# Patient Record
Sex: Male | Born: 1955 | Race: White | Hispanic: No | Marital: Married
Health system: Southern US, Community
[De-identification: ages and names within clinical notes are randomized; demographics above are authoritative.]

---

## 2007-03-04 ENCOUNTER — Emergency Department (HOSPITAL_COMMUNITY): Admission: EM | Admit: 2007-03-04 | Discharge: 2007-03-04 | Payer: Self-pay | Admitting: Emergency Medicine

## 2009-09-20 IMAGING — CR DG HAND COMPLETE 3+V*R*
3 series · 3 of 3 positions shown · non-contrast
Comparison: None

CLINICAL DATA: Trauma

RIGHT HAND - 3 VIEW

[x hand pa right]
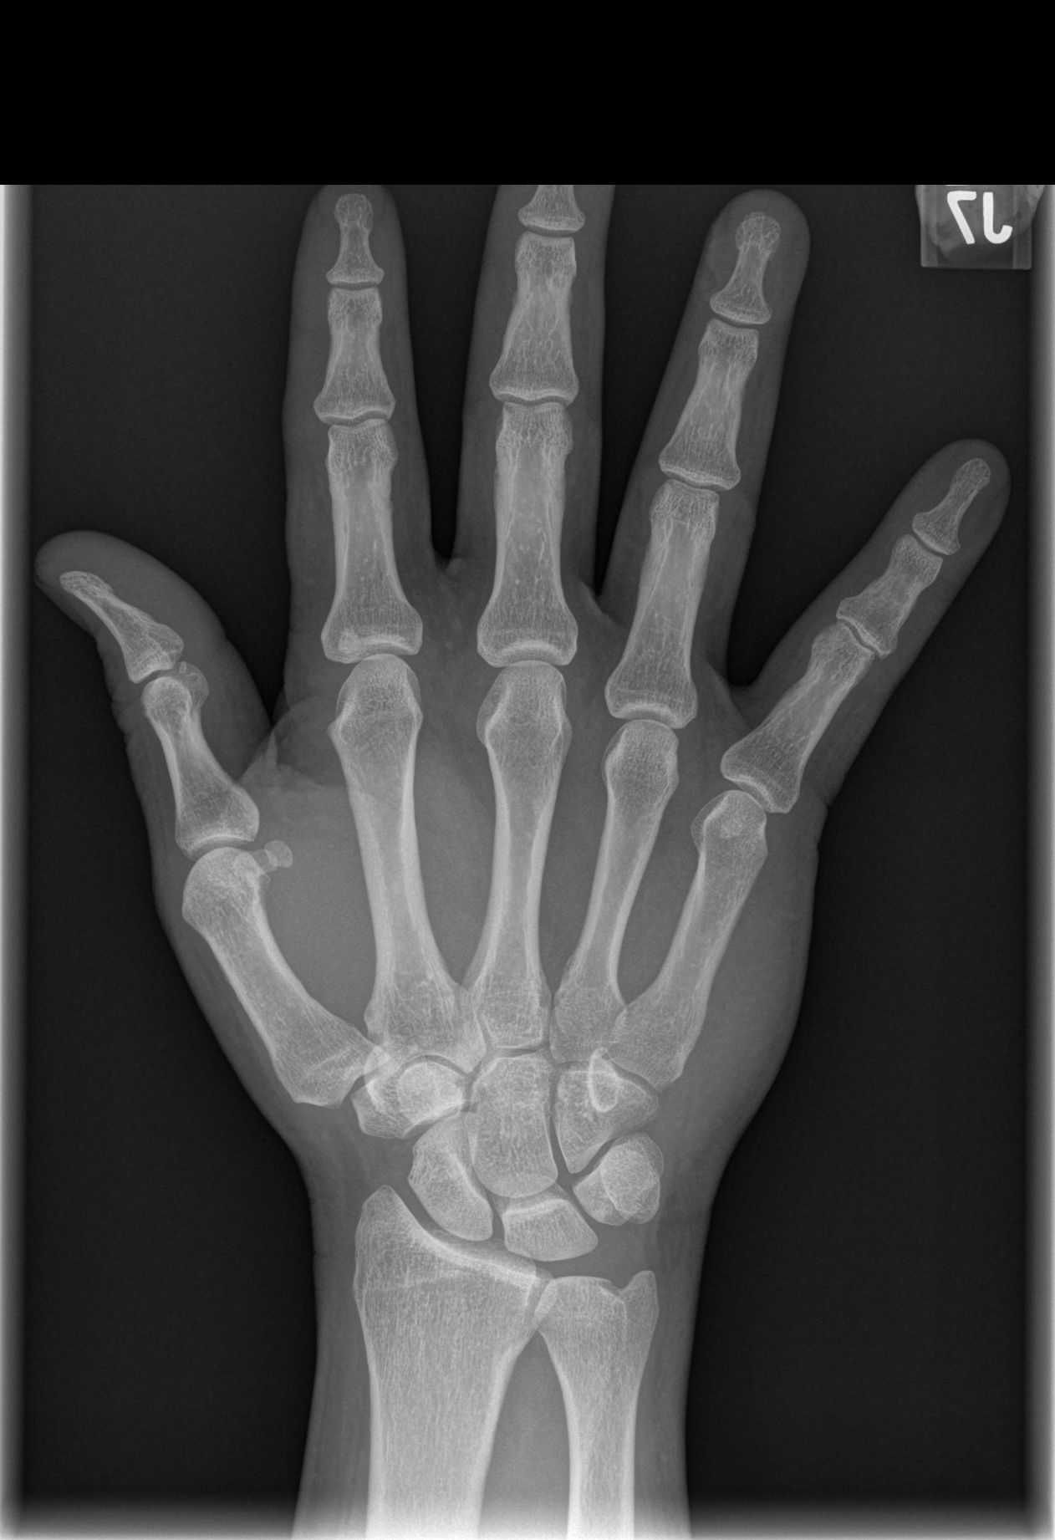

[x hand oblique right]
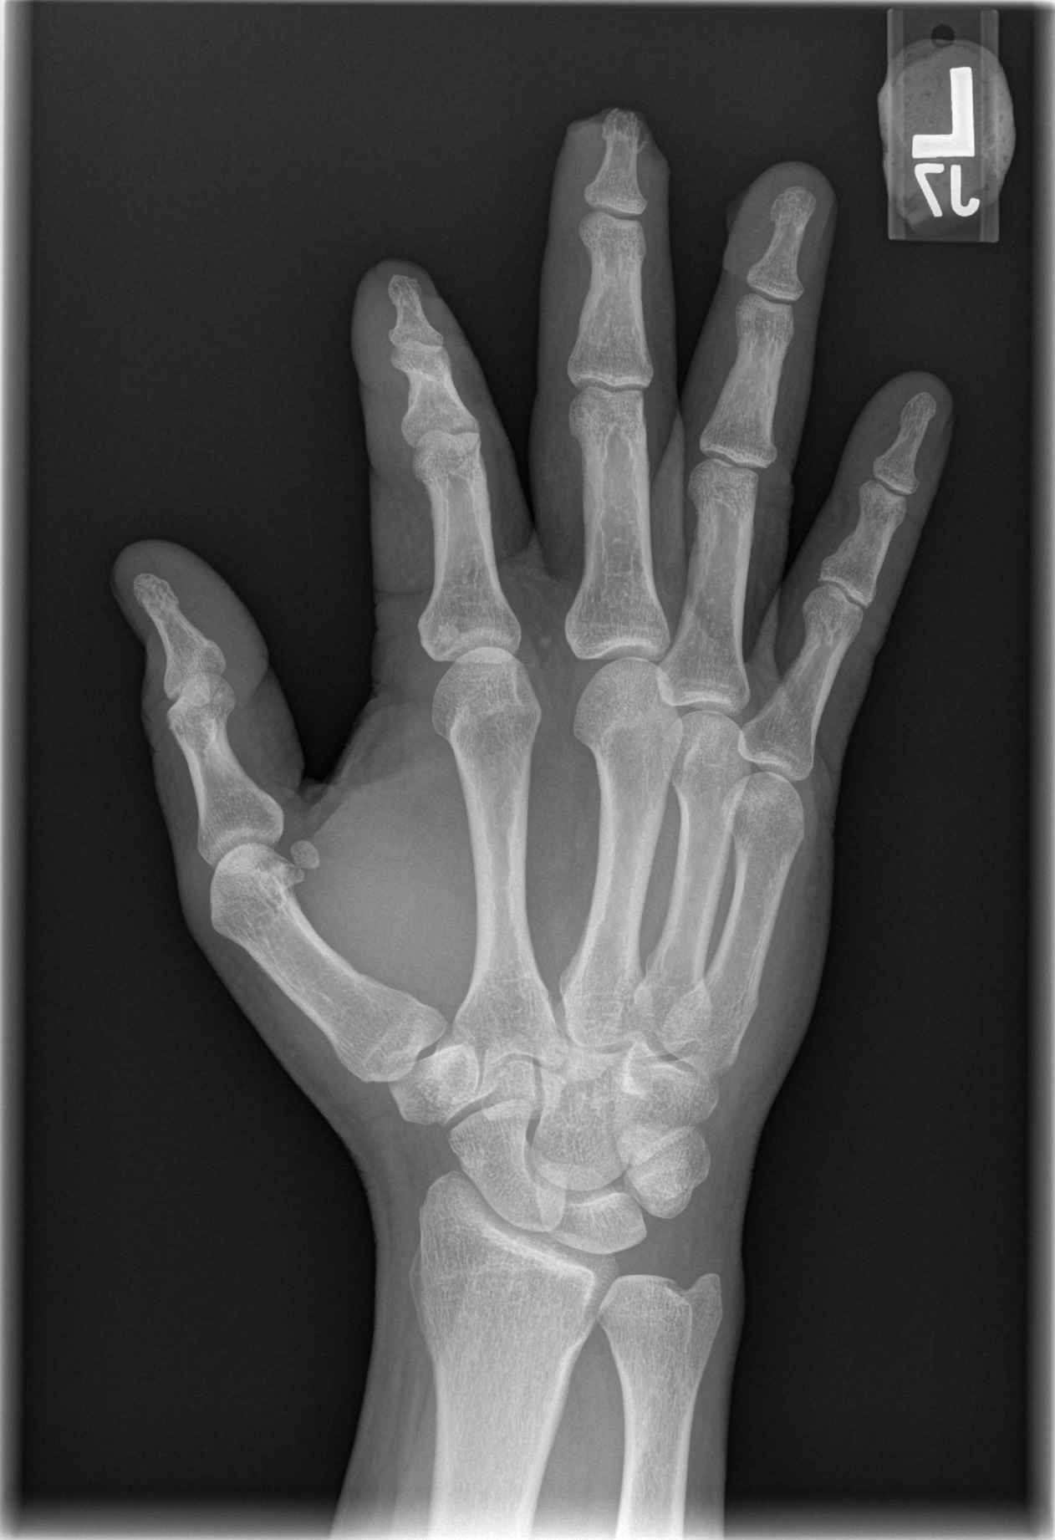

[x hand lat right]
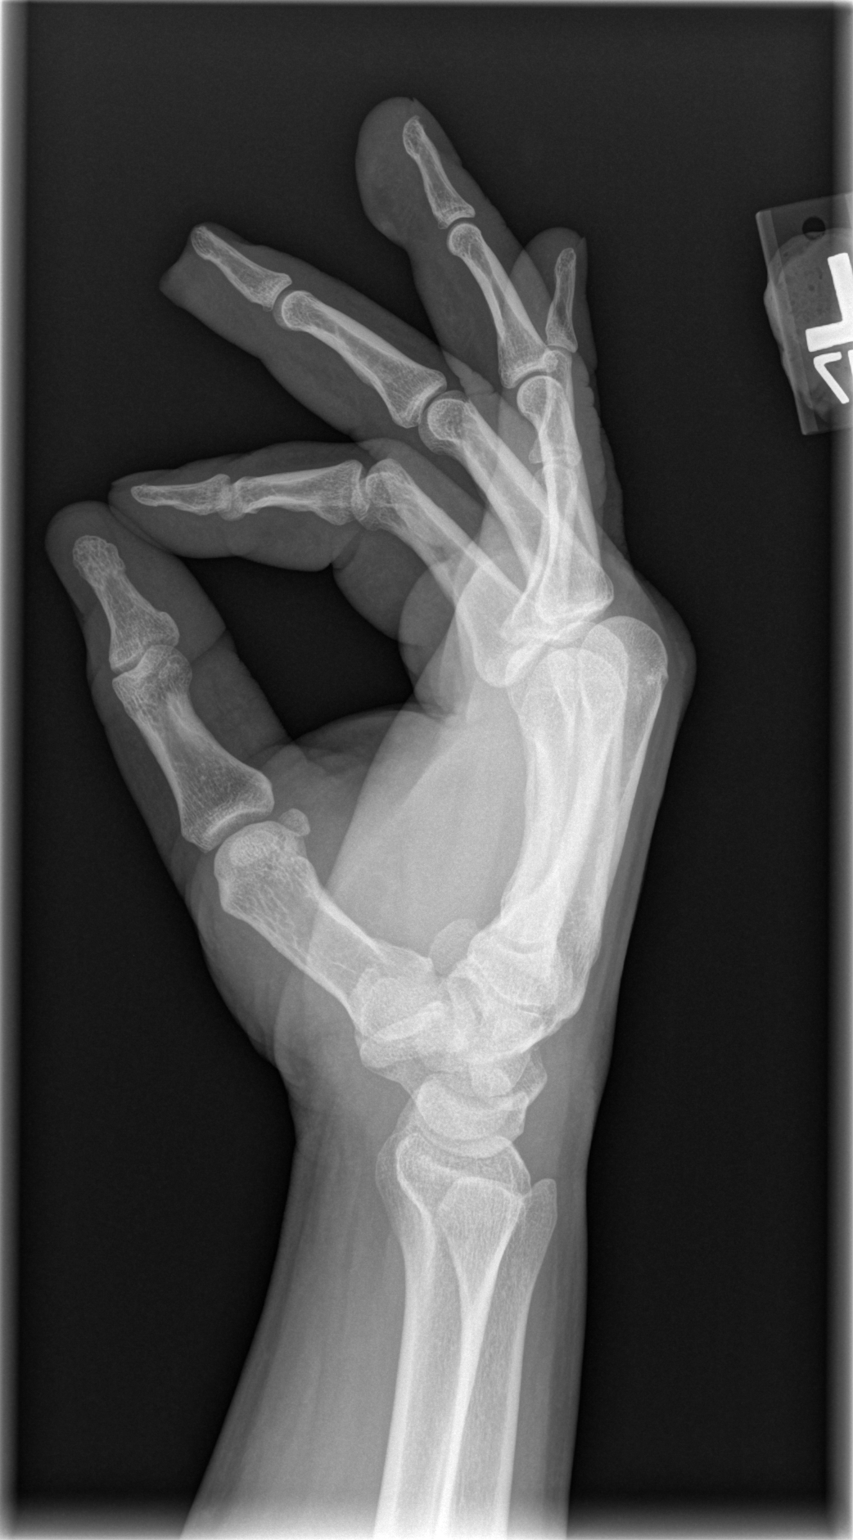

[3 of 3 positions shown; findings below may reference images not displayed]

FINDINGS: Soft tissue injury to the tuft of the third ray. Radiopaque objects
about the ulnar side of the distal phalanx on the AP and oblique views are felt
most likely represent bone fragments. Less likely, these could represent
radiopaque foreign bodies. Not localized on lateral view.

IMPRESSION

Soft tissue injury about the tuft of the third ray with probable small bone
fragments. Less likely radiopaque foreign bodies.

## 2010-08-30 NOTE — Op Note (Signed)
NAMEJACQUES, FIFE               ACCOUNT NO.:  [id number]   MEDICAL RECORD NO.:  [id number]          PATIENT TYPE:  INP   LOCATION:  1827                         FACILITY:  [facility]   PHYSICIAN:  Madelynn Done, MD  DATE OF BIRTH:  [birth year]   DATE OF PROCEDURE:  03/04/2007  DATE OF DISCHARGE:                               OPERATIVE REPORT   PREOPERATIVE DIAGNOSES:  1. Right long right long finger distal tip amputation with open distal      phalanx fracture and exposed bone.  2. Right long finger crush injury with laceration to the volar aspect      of the finger measuring 2.5 cm.   ATTENDING SURGEON:  Dr. Bradly Bienenstock who was scrubbed and present the  entire procedure.   ASSISTANT SURGEON:  None.   PROCEDURE:  1. Right long finger revision amputation with local advancement flaps      and closure.  2. Right long finger debridement of the skin, subcutaneous tissue and      bone associated with open fracture.  3. Right ring finger debridement of skin, subcutaneous tissue and      wound closure, laceration.   ANESTHESIA:  MAC with local anesthesia 10 mL of 0.5% Marcaine, 5 mL of  1% lidocaine mixture digital block of each digit.   TOURNIQUET TIME:  39 minutes at 250 mmHg.   SURGICAL INDICATIONS:  Mr. Carns is a 55 year old right-hand-dominant  gentleman who sustained a crush injury to his the right long and ring  fingers.  He presented to the emergency department with amputations of  the distal portion of the long finger and soft tissue injury to the ring  finger.  The patient seen and evaluated in the emergency department and  signed informed consent was obtained to proceed with the above  procedure.  Risks include but not limited to bleeding, infection and  need for further surgical intervention, loss of motion of the digits,  sensitivity at the tip of the finger and damage to nearby nerves,  arteries or tendons.   DESCRIPTION OF PROCEDURE:  The patient properly  identified in  preoperative holding area and a mark with a permanent marker was made on  the right ring and right long finger indicate correct operative site.  The patient then brought back to the operating room, placed supine on  the anesthesia room table where the MAC anesthesia was administered.  Local anesthetic was then administered into both digits.  The patient  tolerated this well.  Well-padded tourniquet was then placed on the  right forearm and sealed with a 1000 drape.  The right upper extremity  was prepped with Betadine and sterilely draped.  Time out called,  correct side was identified and the procedure was then begun.  Attention  was then turned to the right long finger where debridement of the skin,  subcutaneous tissues and bone was then carried out of the open fracture  of the distal phalanx.  This was then debrided with a small scissors  then curettes and rongeurs.  Portion the distal phalanx was then  debrided  in order to obtain wound closure.  Following the debridement,  this area was then thoroughly irrigated.  A V-Y advancement flap based  volarly at the DIP crease was then fashioned and advanced dorsally in  order to aid for closure.  The dorsal to volar skin was then sewed with  5-0 chromic sutures as well as 5-0 nylon.  There was good  reapproximation over the bone after the advancement flap closure.  Following this, attention was then turned to the ring finger where  debridement of the skin and subcutaneous tissue was then done.  Dissection was carried down through the flexor tendon sheath and the FDP  was noted to be in continuity.  The wound was then thoroughly irrigated  and the laceration was then closed with a 5-0 nylon simple sutures.  There is good reapproximation of the skin edges.  The tourniquet was  then deflated.  There was good perfusion of the skin flaps and the  digits.  Adaptic and a sterile compressive dressing was then applied to  both  fingers.  The patient tolerated the procedure well was then taken  to recovery room in good condition.   POSTOPERATIVE PLAN:  The patient be seen back in the office in four days  for wound check in and then referred down to therapy for some tip  protector splints and work on a little gentle motion.  Sutures likely  out at the two-week mark.      Madelynn Done, MD  Electronically Signed     FWO/MEDQ  D:  03/04/2007  T:  03/05/2007  Job:  [id number]

## 2011-01-24 LAB — RAPID URINE DRUG SCREEN, HOSP PERFORMED
Amphetamines: NOT DETECTED
Barbiturates: NOT DETECTED
Benzodiazepines: NOT DETECTED
Cocaine: NOT DETECTED
Opiates: NOT DETECTED
Tetrahydrocannabinol: NOT DETECTED

## 2020-01-13 ENCOUNTER — Telehealth: Payer: Self-pay | Admitting: Unknown Physician Specialty

## 2020-01-13 NOTE — Telephone Encounter (Signed)
I connected by phone with Andrew Norris on 01/13/2020 at 6:42 PM to discuss the potential use of a new treatment for mild to moderate COVID-19 viral infection in non-hospitalized patients.  This patient is a 64 y.o. male that meets the FDA criteria for Emergency Use Authorization of COVID monoclonal antibody casirivimab/imdevimab or bamlanivimab/eteseviamb.  Has a (+) direct SARS-CoV-2 viral test result  Has mild or moderate COVID-19   Is NOT hospitalized due to COVID-19  Is within 10 days of symptom onset  Has at least one of the high risk factor(s) for progression to severe COVID-19 and/or hospitalization as defined in EUA.  Specific high risk criteria : Cardiovascular disease or hypertension   I have spoken and communicated the following to the patient or parent/caregiver regarding COVID monoclonal antibody treatment:  1. FDA has authorized the emergency use for the treatment of mild to moderate COVID-19 in adults and pediatric patients with positive results of direct SARS-CoV-2 viral testing who are 90 years of age and older weighing at least 40 kg, and who are at high risk for progressing to severe COVID-19 and/or hospitalization.  2. The significant known and potential risks and benefits of COVID monoclonal antibody, and the extent to which such potential risks and benefits are unknown.  3. Information on available alternative treatments and the risks and benefits of those alternatives, including clinical trials.  4. Patients treated with COVID monoclonal antibody should continue to self-isolate and use infection control measures (e.g., wear mask, isolate, social distance, avoid sharing personal items, clean and disinfect "high touch" surfaces, and frequent handwashing) according to CDC guidelines.   5. The patient or parent/caregiver has the option to accept or refuse COVID monoclonal antibody treatment.  After reviewing this information with the patient, the patient has agreed  to receive one of the available covid 19 monoclonal antibodies and will be provided an appropriate fact sheet prior to infusion. Andrew Cirri, NP 01/13/2020 6:42 PM   Sx onset 9/25

## 2020-01-15 ENCOUNTER — Ambulatory Visit (HOSPITAL_COMMUNITY): Payer: Self-pay
